# Patient Record
Sex: Female | Born: 1970 | Hispanic: Yes | Marital: Married | Smoking: Never smoker
Health system: Southern US, Community
[De-identification: ages and names within clinical notes are randomized; demographics above are authoritative.]

## PROBLEM LIST (undated history)

## (undated) DIAGNOSIS — K8020 Calculus of gallbladder without cholecystitis without obstruction: Secondary | ICD-10-CM

## (undated) DIAGNOSIS — N200 Calculus of kidney: Secondary | ICD-10-CM

## (undated) HISTORY — DX: Calculus of kidney: N20.0

---

## 2006-04-14 ENCOUNTER — Emergency Department: Payer: Self-pay | Admitting: Emergency Medicine

## 2006-04-27 ENCOUNTER — Ambulatory Visit: Payer: Self-pay | Admitting: General Surgery

## 2007-09-12 ENCOUNTER — Emergency Department: Payer: Self-pay | Admitting: Internal Medicine

## 2015-08-20 ENCOUNTER — Other Ambulatory Visit: Payer: Self-pay | Admitting: Physician Assistant

## 2015-08-20 DIAGNOSIS — Z1231 Encounter for screening mammogram for malignant neoplasm of breast: Secondary | ICD-10-CM

## 2015-08-27 ENCOUNTER — Ambulatory Visit: Payer: Self-pay

## 2015-09-05 ENCOUNTER — Ambulatory Visit: Payer: Self-pay | Attending: Oncology

## 2019-11-22 ENCOUNTER — Ambulatory Visit: Payer: Self-pay | Attending: Internal Medicine

## 2019-11-22 DIAGNOSIS — Z20822 Contact with and (suspected) exposure to covid-19: Secondary | ICD-10-CM

## 2019-11-22 DIAGNOSIS — U071 COVID-19: Secondary | ICD-10-CM | POA: Insufficient documentation

## 2019-11-23 LAB — NOVEL CORONAVIRUS, NAA: SARS-CoV-2, NAA: DETECTED — AB

## 2019-11-24 ENCOUNTER — Telehealth: Payer: Self-pay | Admitting: Nurse Practitioner

## 2019-11-24 NOTE — Telephone Encounter (Signed)
Called to discuss with Lori Tate about Covid symptoms and the use of bamlanivimab, a monoclonal antibody infusion for those with mild to moderate Covid symptoms and at a high risk of hospitalization.     Pt is qualified for this infusion at the [facility] infusion center due to being a member of an at-risk group. Per chart review patient has no qualified co-morbidities. Attempted to contact patient and further discuss qualifications and assess for eligibility to receive infusion.   Unable to reach patient. Will send MyChart message in addition to left message.   Willette Alma, AGPCNP-BC Pager: [phone] Amion: Thea Alken

## 2021-07-23 NOTE — Progress Notes (Signed)
Patient pre-screened for BCCCP eligibility due to COVID 19 precautions. Two patient identifiers used for verification that I was speaking to correct patient.  Patient to Present directly to [facility] 07/24/21 for BCCCP screening mammogram.

## 2021-07-24 ENCOUNTER — Ambulatory Visit
Admission: RE | Admit: 2021-07-24 | Discharge: 2021-07-24 | Disposition: A | Discharge status: Home / Self Care | Payer: Self-pay | Source: Ambulatory Visit | Attending: Oncology | Admitting: Oncology

## 2021-07-24 ENCOUNTER — Other Ambulatory Visit: Payer: Self-pay

## 2021-07-24 ENCOUNTER — Ambulatory Visit: Payer: Self-pay | Attending: Oncology

## 2021-07-24 DIAGNOSIS — Z0000 Encounter for general adult medical examination without abnormal findings: Secondary | ICD-10-CM | POA: Insufficient documentation

## 2021-07-25 ENCOUNTER — Other Ambulatory Visit: Payer: Self-pay | Admitting: *Deleted

## 2021-07-25 ENCOUNTER — Inpatient Hospital Stay
Admission: RE | Admit: 2021-07-25 | Discharge: 2021-07-25 | Disposition: A | Discharge status: Home / Self Care | Payer: Self-pay | Source: Ambulatory Visit | Attending: *Deleted | Admitting: *Deleted

## 2021-07-25 DIAGNOSIS — Z1231 Encounter for screening mammogram for malignant neoplasm of breast: Secondary | ICD-10-CM

## 2021-08-04 NOTE — Progress Notes (Signed)
Letter mailed from [facility] to notify of normal mammogram results.  Patient to return in one year for annual screening.  Copy to [facility]. 

## 2022-07-22 ENCOUNTER — Other Ambulatory Visit: Payer: Self-pay | Admitting: Family Medicine

## 2022-07-22 DIAGNOSIS — Z1231 Encounter for screening mammogram for malignant neoplasm of breast: Secondary | ICD-10-CM

## 2022-09-21 IMAGING — MG MM DIGITAL SCREENING BILAT W/ TOMO AND CAD
8 series · 8 of 24 positions shown · non-contrast
Comparison: Previous exam(s).

CLINICAL DATA: Screening.

EXAM:
DIGITAL SCREENING BILATERAL MAMMOGRAM WITH TOMOSYNTHESIS AND CAD
TECHNIQUE: Bilateral screening digital craniocaudal and mediolateral oblique
mammograms were obtained. Bilateral screening digital breast
tomosynthesis was performed. The images were evaluated with
computer-aided detection.

[R MLO synth-2D]
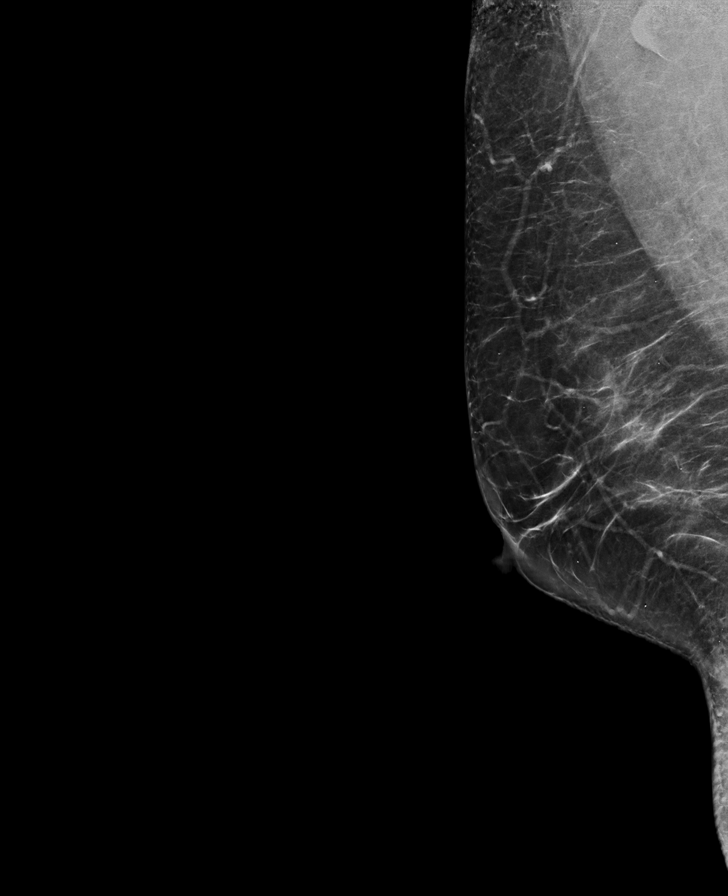

[L MLO synth-2D]
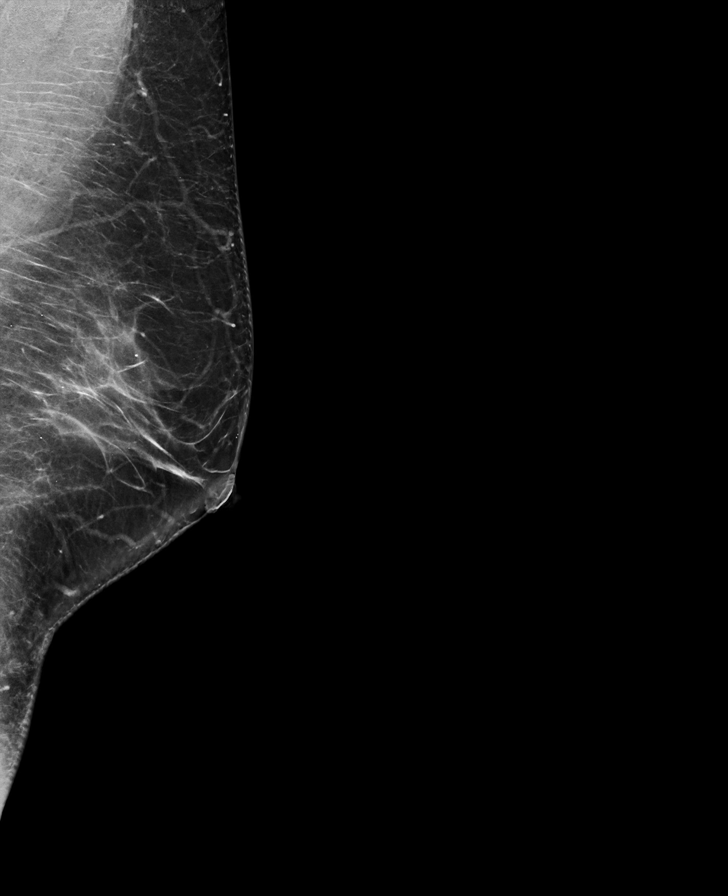

[L CC synth-2D]
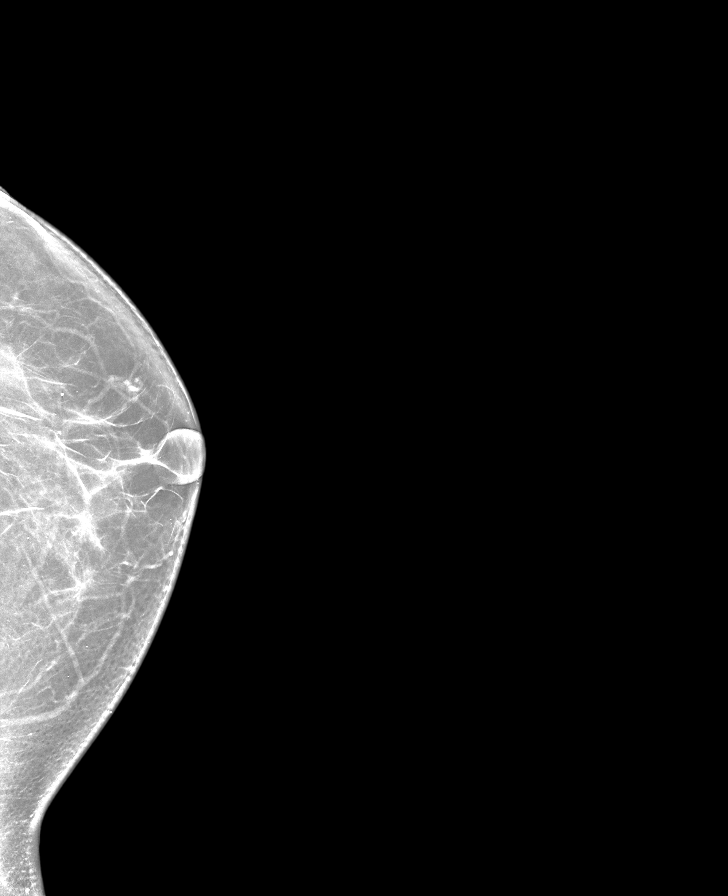

[R CC synth-2D]
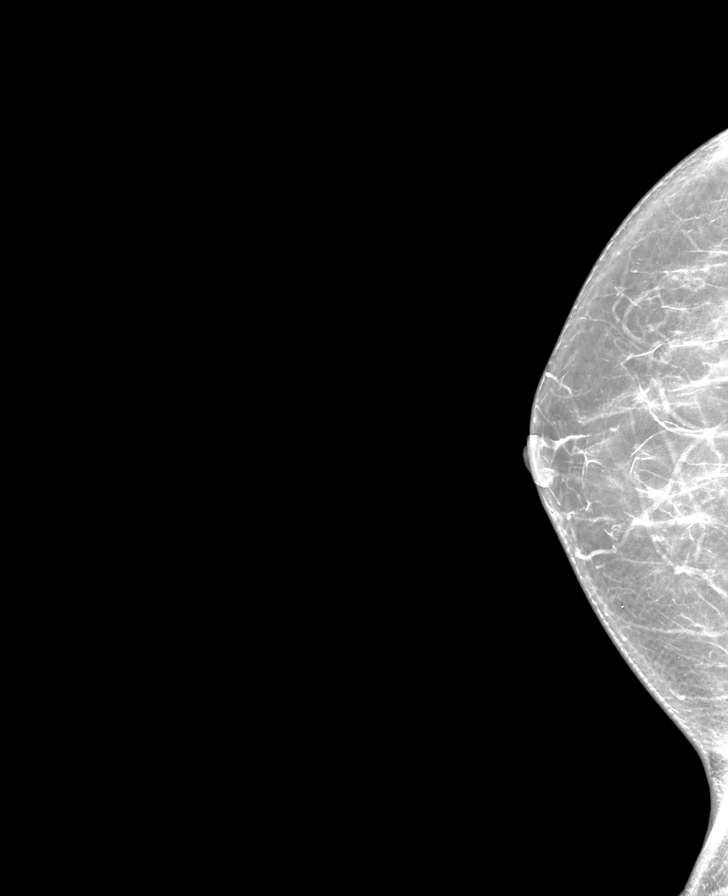

[R MLO tomo · tomo slice 39/77.0]
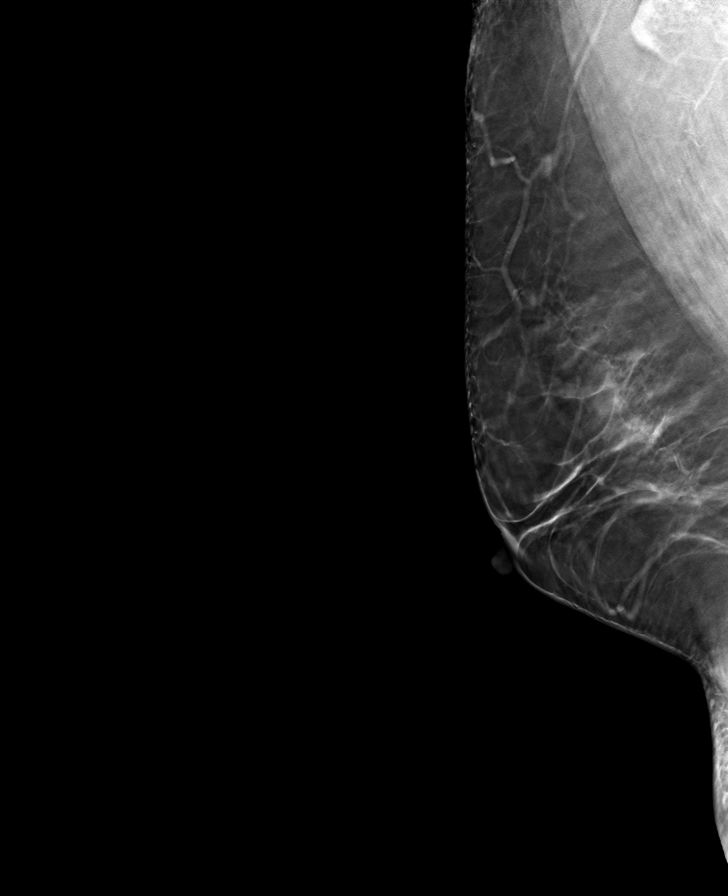

[L CC tomo · tomo slice 37/73.0]
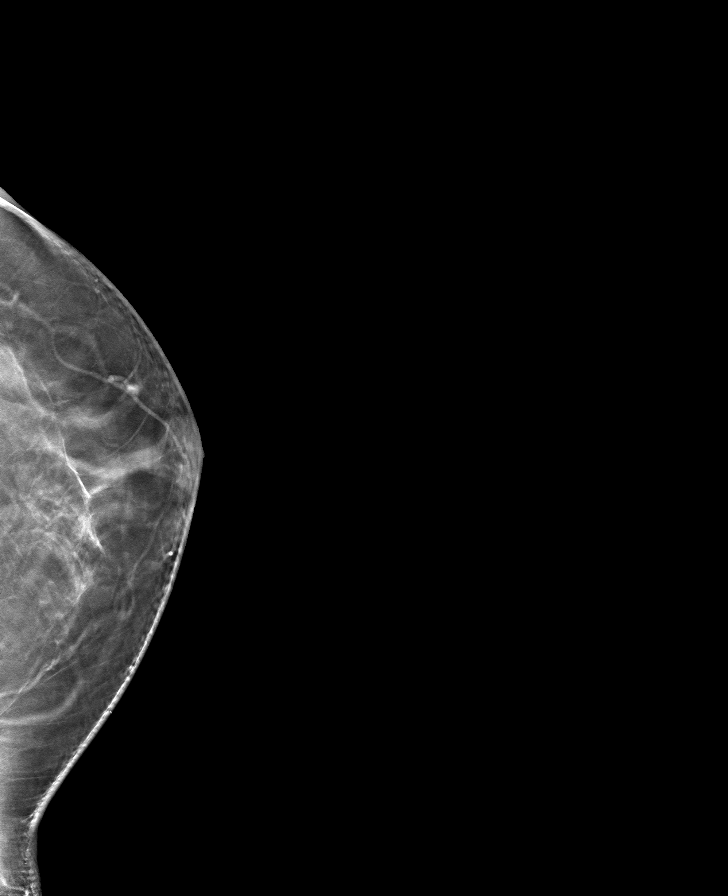

[L MLO tomo · tomo slice 41/81.0]
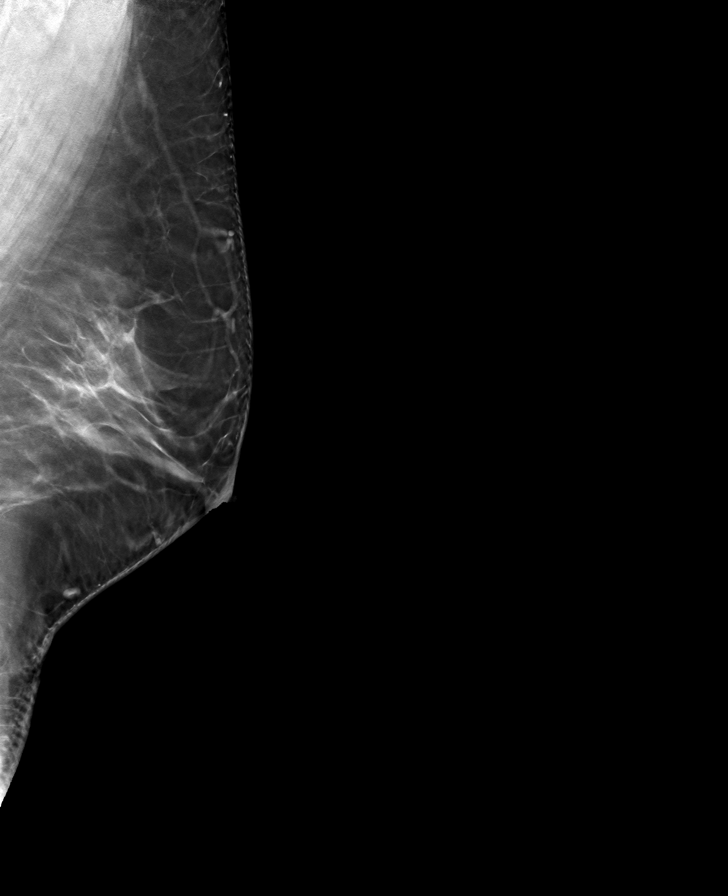

[R CC tomo · tomo slice 33/64.0]
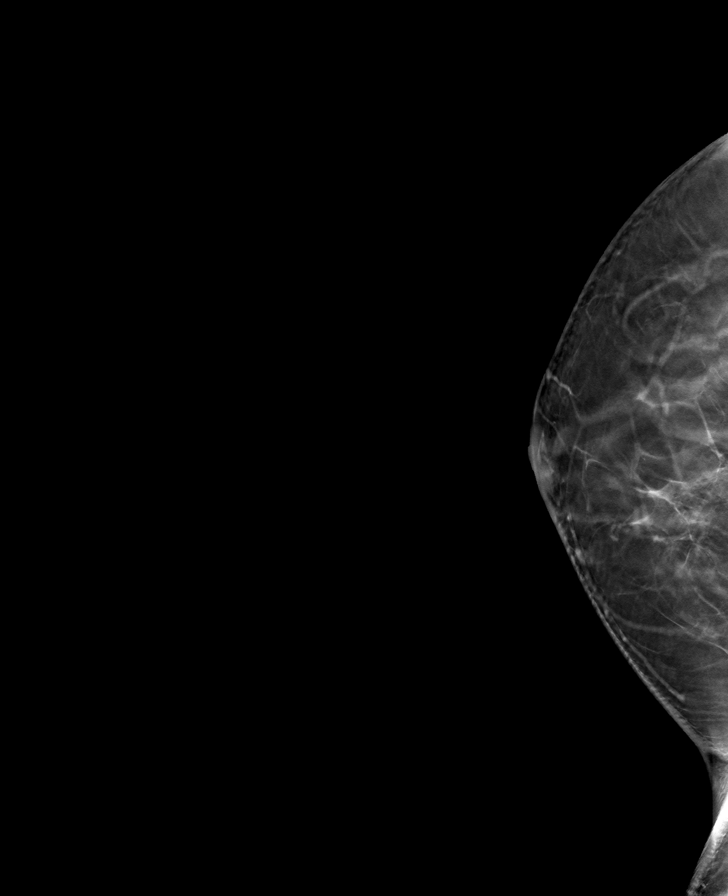

[8 of 24 positions shown; findings below may reference images not displayed]

ACR Breast Density Category b: There are scattered areas of
fibroglandular density.
FINDINGS: There are no findings suspicious for malignancy.
IMPRESSION: No mammographic evidence of malignancy. A result letter of this
screening mammogram will be mailed directly to the patient.

RECOMMENDATION:
Screening mammogram in one year. (Code:51-O-LD2)

BI-RADS CATEGORY  1: Negative.

## 2023-06-08 ENCOUNTER — Other Ambulatory Visit: Payer: Self-pay

## 2023-06-08 ENCOUNTER — Emergency Department
Admission: EM | Admit: 2023-06-08 | Discharge: 2023-06-08 | Disposition: A | Discharge status: Home / Self Care | Payer: 59 | Attending: Emergency Medicine | Admitting: Emergency Medicine

## 2023-06-08 ENCOUNTER — Emergency Department: Payer: 59

## 2023-06-08 DIAGNOSIS — R109 Unspecified abdominal pain: Secondary | ICD-10-CM | POA: Diagnosis not present

## 2023-06-08 DIAGNOSIS — N132 Hydronephrosis with renal and ureteral calculous obstruction: Secondary | ICD-10-CM | POA: Diagnosis not present

## 2023-06-08 DIAGNOSIS — K429 Umbilical hernia without obstruction or gangrene: Secondary | ICD-10-CM | POA: Diagnosis not present

## 2023-06-08 DIAGNOSIS — N134 Hydroureter: Secondary | ICD-10-CM | POA: Diagnosis not present

## 2023-06-08 DIAGNOSIS — N201 Calculus of ureter: Secondary | ICD-10-CM | POA: Diagnosis not present

## 2023-06-08 HISTORY — DX: Calculus of gallbladder without cholecystitis without obstruction: K80.20

## 2023-06-08 LAB — COMPREHENSIVE METABOLIC PANEL
ALT: 51 U/L — ABNORMAL HIGH (ref 0–44)
AST: 53 U/L — ABNORMAL HIGH (ref 15–41)
Albumin: 4.4 g/dL (ref 3.5–5.0)
Alkaline Phosphatase: 89 U/L (ref 38–126)
Anion gap: 15 (ref 5–15)
BUN: 17 mg/dL (ref 6–20)
CO2: 17 mmol/L — ABNORMAL LOW (ref 22–32)
Calcium: 9 mg/dL (ref 8.9–10.3)
Chloride: 106 mmol/L (ref 98–111)
Creatinine, Ser: 0.7 mg/dL (ref 0.44–1.00)
GFR, Estimated: >60 mL/min (ref >60)
Glucose, Bld: 119 mg/dL — ABNORMAL HIGH (ref 70–99)
Potassium: 3.8 mmol/L (ref 3.5–5.1)
Sodium: 138 mmol/L (ref 135–145)
Total Bilirubin: 1 mg/dL (ref 0.3–1.2)
Total Protein: 7.5 g/dL (ref 6.5–8.1)

## 2023-06-08 LAB — URINALYSIS, ROUTINE W REFLEX MICROSCOPIC
Bacteria, UA: NONE SEEN
Bilirubin Urine: NEGATIVE
Glucose, UA: NEGATIVE mg/dL
Ketones, ur: NEGATIVE mg/dL
Leukocytes,Ua: NEGATIVE
Nitrite: NEGATIVE
Protein, ur: 30 mg/dL — AB
RBC / HPF: >50 RBC/hpf (ref 0–5)
Specific Gravity, Urine: 1.024 (ref 1.005–1.030)
pH: 5 (ref 5.0–8.0)

## 2023-06-08 LAB — CBC
HCT: 43.5 % (ref 36.0–46.0)
Hemoglobin: 14.9 g/dL (ref 12.0–15.0)
MCH: 30.6 pg (ref 26.0–34.0)
MCHC: 34.3 g/dL (ref 30.0–36.0)
MCV: 89.3 fL (ref 80.0–100.0)
Platelets: 222 10*3/uL (ref 150–400)
RBC: 4.87 MIL/uL (ref 3.87–5.11)
RDW: 13.7 % (ref 11.5–15.5)
WBC: 8.7 10*3/uL (ref 4.0–10.5)
nRBC: 0 % (ref 0.0–0.2)

## 2023-06-08 LAB — LIPASE, BLOOD: Lipase: 36 U/L (ref 11–51)

## 2023-06-08 MED ORDER — ONDANSETRON HCL 4 MG/2ML IJ SOLN
4.0000 mg | Freq: Once | INTRAMUSCULAR | Status: AC
  Administered 2023-06-08: 4 mg via INTRAVENOUS
  Filled 2023-06-08: qty 2

## 2023-06-08 MED ORDER — MORPHINE SULFATE (PF) 4 MG/ML IV SOLN
4.0000 mg | Freq: Once | INTRAVENOUS | Status: AC
  Administered 2023-06-08: 4 mg via INTRAVENOUS
  Filled 2023-06-08: qty 1

## 2023-06-08 MED ORDER — TAMSULOSIN HCL 0.4 MG PO CAPS: 0.4000 mg | ORAL_CAPSULE | Freq: Every day | ORAL | 0 refills | Status: AC

## 2023-06-08 MED ORDER — KETOROLAC TROMETHAMINE 30 MG/ML IJ SOLN
30.0000 mg | Freq: Once | INTRAMUSCULAR | Status: AC
  Administered 2023-06-08: 30 mg via INTRAMUSCULAR
  Filled 2023-06-08: qty 1

## 2023-06-08 MED ORDER — ONDANSETRON 4 MG PO TBDP: 4.0000 mg | ORAL_TABLET | Freq: Three times a day (TID) | ORAL | 0 refills | Status: AC | PRN

## 2023-06-08 MED ORDER — OXYCODONE-ACETAMINOPHEN 5-325 MG PO TABS: 1.0000 | ORAL_TABLET | Freq: Four times a day (QID) | ORAL | 0 refills | Status: AC | PRN

## 2023-06-08 NOTE — Discharge Instructions (Addendum)
Call make an appointment with Dr. Signa Kell who is on-call for urology.  Also you need to see your primary care provider for follow-up of a cyst to your left ovary.  Medication was sent to your pharmacy in [place].  Do not drive or operate machinery while taking the pain medication as it could cause drowsiness and increase your risk for injury.  Turn to the emergency department if any severe worsening of your symptoms.

## 2023-06-08 NOTE — ED Provider Notes (Signed)
[facility] Provider Note    Event Date/Time   First MD Initiated Contact with Patient 06/08/23 1217     (approximate)   History   Flank Pain   HPI History obtained with daughter who is present. Lori Tate is a 52 y.o. female presents to the ED with complaint of right sided flank pain intermittently for the last couple of weeks but worse today with nausea and increased pain.  Patient does have a history of kidney stones in the past.  Patient has a history of gallstones in addition to her kidney stones.     Physical Exam   Triage Vital Signs: ED Triage Vitals  Encounter Vitals Group     BP 06/08/23 1113 (!) 151/93     Systolic BP Percentile --      Diastolic BP Percentile --      Pulse Rate 06/08/23 1113 70     Resp 06/08/23 1113 18     Temp 06/08/23 1113 98 F (36.7 C)     Temp src --      SpO2 06/08/23 1113 99 %     Weight 06/08/23 1115 155 lb (70.3 kg)     Height --      Head Circumference --      Peak Flow --      Pain Score 06/08/23 1115 8     Pain Loc --      Pain Education --      Exclude from Growth Chart --     Most recent vital signs: Vitals:   06/08/23 1113 06/08/23 1443  BP: (!) 151/93 (!) 148/76  Pulse: 70 68  Resp: 18 17  Temp: 98 F (36.7 C) 98.4 F (36.9 C)  SpO2: 99% 98%     General: Awake, no distress.  Obviously uncomfortable. CV:  Good peripheral perfusion.  Resp:  Normal effort.  Lungs are clear bilaterally. Abd:  No distention.  Other:  Right flank pain.   ED Results / Procedures / Treatments   Labs (all labs ordered are listed, but only abnormal results are displayed) Labs Reviewed  COMPREHENSIVE METABOLIC PANEL - Abnormal; Notable for the following components:      Result Value   CO2 17 (*)    Glucose, Bld 119 (*)    AST 53 (*)    ALT 51 (*)    All other components within normal limits  URINALYSIS, ROUTINE W REFLEX MICROSCOPIC - Abnormal; Notable for the following components:    Color, Urine YELLOW (*)    APPearance CLOUDY (*)    Hgb urine dipstick LARGE (*)    Protein, ur 30 (*)    All other components within normal limits  LIPASE, BLOOD  CBC     RADIOLOGY  CT renal stone study per radiologist shows a 3.3 mm right proximal calculus causing mild proximal hydronephrosis and hydroureter ureter.  Patient also has a fluid-filled cyst on the left adnexal area which they recommend having rechecked in approximately 3-6 months.  PROCEDURES:  Critical Care performed:   Procedures   MEDICATIONS ORDERED IN ED: Medications  morphine (PF) 4 MG/ML injection 4 mg (4 mg Intravenous Given 06/08/23 1243)  ondansetron (ZOFRAN) injection 4 mg (4 mg Intravenous Given 06/08/23 1243)  ketorolac (TORADOL) 30 MG/ML injection 30 mg (30 mg Intramuscular Given 06/08/23 1531)     IMPRESSION / MDM / ASSESSMENT AND PLAN / ED COURSE  I reviewed the triage vital signs and the nursing notes.  Differential diagnosis includes, but is not limited to, urinary tract infection, urolithiasis, ovarian cyst considered.  52 year old female presents to the ED with complaint of right flank pain with nausea and vomiting that started suddenly approximately 1 hour prior to arrival.  Patient has prior history of kidney stones.  With the help of Loyda, who is a Spanish interpreter here at [facility] explained to the patient that she does have a kidney stone and also the cyst that was noted on her left adnexal area.  Patient states that she has a appointment with her PCP on October 19 and will mention it to her doctor at that time.  Patient's pain was improved after being given IV morphine, Zofran prior to imaging and Toradol 30 mg IV.  Patient was discharged with a prescription for Zofran ODT, Percocet and Flomax.  She was given instructions to follow-up with Dr. Signa Kell who is on-call for urology or return to the emergency department if any severe worsening of her symptoms.      Patient's presentation is  most consistent with acute illness / injury with system symptoms.  FINAL CLINICAL IMPRESSION(S) / ED DIAGNOSES   Final diagnoses:  Ureterolithiasis     Rx / DC Orders   ED Discharge Orders          Ordered    tamsulosin (FLOMAX) 0.4 MG CAPS capsule  Daily        06/08/23 1510    oxyCODONE-acetaminophen (PERCOCET) 5-325 MG tablet  Every 6 hours PRN        06/08/23 1510    ondansetron (ZOFRAN-ODT) 4 MG disintegrating tablet  Every 8 hours PRN        06/08/23 1510             Note:  This document was prepared using Dragon voice recognition software and may include unintentional dictation errors.   Tommi Rumps, PA-C 06/08/23 1547    Trinna Post, MD 06/10/23 0001

## 2023-06-08 NOTE — ED Notes (Signed)
PT advises that she is having right lower quad flank pain. Pt sts that is has been going on for the last three days. Pt does endorse N/V.

## 2023-06-08 NOTE — ED Triage Notes (Signed)
Pt to ED for right sided flank pain intermittent for past couple weeks. Reports nausea with pain.

## 2023-06-22 DIAGNOSIS — Z1331 Encounter for screening for depression: Secondary | ICD-10-CM | POA: Diagnosis not present

## 2023-06-22 DIAGNOSIS — Z1389 Encounter for screening for other disorder: Secondary | ICD-10-CM | POA: Diagnosis not present

## 2023-06-22 DIAGNOSIS — Z87442 Personal history of urinary calculi: Secondary | ICD-10-CM | POA: Diagnosis not present

## 2023-06-22 DIAGNOSIS — N83292 Other ovarian cyst, left side: Secondary | ICD-10-CM | POA: Diagnosis not present

## 2023-06-23 ENCOUNTER — Ambulatory Visit (INDEPENDENT_AMBULATORY_CARE_PROVIDER_SITE_OTHER): Payer: Self-pay | Admitting: Urology

## 2023-06-23 ENCOUNTER — Encounter: Payer: Self-pay | Admitting: Urology

## 2023-06-23 VITALS — BP 124/72 | HR 58 | Ht <= 58 in | Wt 155.4 lb

## 2023-06-23 DIAGNOSIS — N200 Calculus of kidney: Secondary | ICD-10-CM

## 2023-06-23 DIAGNOSIS — N201 Calculus of ureter: Secondary | ICD-10-CM

## 2023-06-23 NOTE — Patient Instructions (Signed)
Recomendaciones dietticas para prevenir la formacin de clculos renales Dietary Guidelines to Help Prevent Kidney Stones Los clculos renales son depsitos de minerales y sales que se forman en el interior de los riones. Su riesgo de tener clculos renales puede ser mayor en funcin de su dieta, el estilo de vida, los medicamentos que toma y la presencia de ciertas afecciones mdicas. La mayora de las personas pueden reducir el riesgo de desarrollar clculos renales siguiendo estas pautas alimentarias. Su nutricionista puede darle indicaciones ms especficas segn su estado de salud general y el tipo de clculos renales que tienda a desarrollar. Consejos para seguir este plan Al leer las etiquetas de los alimentos  Elija alimentos con etiquetas que digan "sin sal agregada" o "bajo contenido de sal". Limite el consumo de sal (sodio) a menos de 1,500 mg por da. Elija alimentos con calcio para incluirlos en cada comida o refrigerio. Trate de incorporar 300 mg de calcio en cada comida. Entre los alimentos que contienen de 200 a 500 mg de calcio por porcin se incluyen los siguientes: 8 onzas (237 ml) de leche, leche no lcteafortificada con calcio y jugo de frutafortificado con calcio. Que una bebida est fortificada con calcio significa que se le ha aadido calcio. 8 onzas (237 ml) de kfir, yogur y yogur de soja. 4 onzas (114 ml) de tofu. 1 onza (28 g) de queso. 1 taza (150 g) de higos secos. 1 taza (91 g) de brcoli cocido. Una lata de 3 onzas (85 g) de sardinas o caballa. La mayora de las personas necesitan de 1,000 a 1,500 mg de calcio por da. Hable con su nutricionista sobre la cantidad de calcio que recomendara para usted. Al ir de compras Compre mucha fruta y verdura fresca. La mayora de las personas no necesitan dejar de consumir frutas y verduras, aun cuando contengan nutrientes que puedan contribuir a formar clculos renales. Cuando compre alimentos semipreparados, elija los  siguientes: Frutas enteras. Ensaladas preparadas con aderezo aparte. Batidos de fruta y yogur descremados. Evite comprar comidas congeladas o fiambres. Estos pueden tener un alto contenido de sodio. Busque alimentos con cultivos vivos, como yogur y kfir. Elija cereales con alto contenido de fibras, como panes integrales, salvado de avena y cereales de trigo integral. Al cocinar No agregue sal a los alimentos cuando cocine. Ponga un salero en la mesa y deje que cada persona agregue sal a su gusto. Para las pastas, guisos y sopas, use protenas vegetales, como frijoles, protena vegetal texturada o tofu, en lugar de carnes. Planificacin de las comidas Si su nutricionista se lo indica, ingiera menos sal. Para hacer esto: Evite consumir alimentos procesados o preelaborados. Evite las comidas rpidas. Coma menos protenas animales, como queso, carne de vaca, ave o pescado, si as se lo indica su nutricionista. Para hacer esto: Limite la cantidad de veces que ingiere carne de vaca, ave, pescado, o queso por semana. Siga una dieta sin carne vacuna, al menos 2 das por semana. Coma solo una porcin por da de carne de vaca, ave, pescado o mariscos. Cuando prepare protenas animales, corte los trozos en porciones pequeas. En trminos generales, una porcin de carne vacuna o de pescado tiene el tamao aproximado de la palma de la mano. Coma al menos cinco porciones de frutas y verduras frescas por da. Para hacer esto: Tenga a mano frutas y verduras para los refrigerios. Coma una fruta o un puado de frutos rojos con el desayuno. Coma una ensalada y fruta en el almuerzo. Incorpore dos clases de   verduras en la cena. Es posible que le hayan indicado que limite los alimentos con alto contenido de una sustancia llamada oxalato. Estos incluyen: Espinaca (cocida), ruibarbo, remolachas, batatas y acelga. Manes. Papas fritas de bolsa, papas fritas y papas al horno con piel. Frutos secos y productos con  frutos secos. Chocolate. Si toma diurticos regularmente, asegrese de comer al menos 1 o 2 porciones de frutas o verduras con alto contenido de potasio todos los das. Estos incluyen: [place]. Banana. Naranja, ciruela pasa, zanahoria, o jugo de tomate. Patata al horno. Repollo. Frijoles y arvejas partidas. Estilo de vida  Beba suficiente lquido como para mantener la orina de color amarillo plido. Esto es lo ms importante que puede hacer. Distribuya la ingesta de lquidos durante el da. Si bebe alcohol: Limite la cantidad que bebe a lo siguiente: De 0 a 1 medida por da para las mujeres que no estn embarazadas. De 0 a 2 medidas por da para los hombres. Sepa cunta cantidad de alcohol hay en las bebidas que toma. En los [place], una medida equivale a una botella de cerveza de 12 oz (355 ml), un vaso de vino de 5 oz (148 ml) o un vaso de una bebida alcohlica de alta graduacin de 1 oz (44 ml). Si su mdico se lo indica, baje de peso. Trabaje con su nutricionista para encontrar el plan de alimentacin y las estrategias para bajar de peso que mejor funcionen para usted. Informacin general Hable con su mdico y su nutricionista sobre tomar suplementos diarios. En funcin de su salud y de la causa de sus clculos renales, usted puede recibir las siguientes recomendaciones: No tomar suplementos de vitamina C en dosis altas (1000 mg al da o ms). Tomar un suplemento de calcio. Tomar un suplemento probitico diario. Tomar otros suplementos como magnesio, aceite de pescado o vitamina B6. Use los medicamentos de venta libre y los recetados solamente como se lo haya indicado el mdico. Estos incluyen suplementos. Qu alimentos debo limitar? Limite la ingesta de los siguientes alimentos, o cmalos segn se lo indique su nutricionista. Verduras Espinaca. Ruibarbo. Remolachas. Verduras enlatadas. Pepinillos. Aceitunas. Papas al horno con piel. Granos Salvado de trigo. Panificados.  Galletas saladas. Cereales con alto contenido de azcar. Carnes y otras protenas Frutos secos. Mantequilla de frutos secos. Porciones grandes de carne vacuna, de ave o de pescado. Carnes saladas, precocidas o curadas, como salchichas, panes de carne y hot dogs. Lcteos Quesos. Bebidas Refrescos regulares. Jugo de verduras regular. Alios y condimentos Mezclas de condimentos con sal. Condimentos para ensalada. Salsa de soja. Ktchup. Salsa barbacoa. Otros alimentos Sopas enlatadas. Salsa en lata para pastas. Guisos. Pizza. Lasaa. Comidas congeladas. Papas fritas en bolsa. Papas fritas. Es posible que los productos que se enumeran ms arriba no constituyan una lista completa de los alimentos y las bebidas que debe limitar. Consulte a un nutricionista para obtener ms informacin. Qu alimentos debo evitar? Hable con su nutricionista sobre los alimentos especficos que debera evitar de acuerdo con el tipo de clculos renales que tiene y su estado de salud general. Frutas Pomelo. Es posible que los productos que se enumeran ms arriba no constituyan una lista completa de los alimentos y las bebidas que debe evitar. Consulte a un nutricionista para obtener ms informacin. Resumen Los clculos renales son depsitos de minerales y sales que se forman en el interior de los riones. Puede reducir el riesgo de tener clculos renales si introduce cambios en su alimentacin. Lo ms importante es que beba mucho lquido. Beba   suficiente lquido como para Pharmacologist la orina de color amarillo plido. Hable con su nutricionista sobre la cantidad de calcio que debe consumir por da y consuma menos sal y protenas animales como se lo haya indicado el nutricionista. Esta informacin no tiene Theme park manager el consejo del mdico. Asegrese de hacerle al mdico cualquier pregunta que tenga. Document Revised: 02/04/2022 Document Reviewed: 02/04/2022 Elsevier Patient Education  2024 [employer].

## 2023-06-23 NOTE — Progress Notes (Signed)
   06/23/23 1:16 PM   SUMMERLIN DIMERY [facility] 10/25/1970 [id number]  CC: Nephrolithiasis  HPI: 52 year old Spanish-speaking female here today for evaluation of nephrolithiasis.  She presented to the ER on 06/08/2023 with severe right-sided flank pain, and CT showed a 3 mm right proximal ureteral stone.  She thinks she passed the stone, and has not had any pain since a few days after being seen in the ER.  She denies any gross hematuria or urinary symptoms.  No prior stone events.  PMH: Past Medical History:  Diagnosis Date   Gallstones    Kidney stone     Family History: Family History  Problem Relation Age of Onset   Breast cancer Neg Hx     Social History:  reports that she has never smoked. She has never used smokeless tobacco. She reports that she does not currently use alcohol. She reports that she does not use drugs.  Physical Exam: BP 124/72 (BP Location: Left Arm, Patient Position: Sitting, Cuff Size: Large)   Pulse (!) 58   Ht 4\' 10"  (1.473 m)   Wt 155 lb 6.4 oz (70.5 kg)   BMI 32.48 kg/m    Constitutional:  Alert and oriented, No acute distress. Cardiovascular: No clubbing, cyanosis, or edema. Respiratory: Normal respiratory effort, no increased work of breathing. GI: Abdomen is soft, nontender, nondistended, no abdominal masses   Laboratory Data: Reviewed  Pertinent Imaging: I have personally viewed and interpreted the CT stone protocol dated 06/08/2023 showing a 3 mm right proximal ureteral stone.  Assessment & Plan:   52 year old female with likely spontaneously passed 3 mm right sided ureteral stone with resolution of symptoms over the last 2 weeks.  Return precautions were discussed, and we spent most of our time today discussing stone prevention.  We discussed general stone prevention strategies including adequate hydration with goal of producing 2.5 L of urine daily, increasing citric acid intake, increasing calcium intake during high oxalate meals,  minimizing animal protein, and decreasing salt intake.  Also counseled to avoid high-dose vitamin C supplements.  Information about dietary recommendations given today.   Follow-up with urology as needed  Legrand Rams, MD 06/23/2023  [facility] [address] [place], [place] [address] [phone]

## 2023-07-29 ENCOUNTER — Other Ambulatory Visit: Payer: Self-pay | Admitting: Family Medicine

## 2023-07-29 DIAGNOSIS — R7309 Other abnormal glucose: Secondary | ICD-10-CM | POA: Diagnosis not present

## 2023-07-29 DIAGNOSIS — Z131 Encounter for screening for diabetes mellitus: Secondary | ICD-10-CM | POA: Diagnosis not present

## 2023-07-29 DIAGNOSIS — Z1389 Encounter for screening for other disorder: Secondary | ICD-10-CM | POA: Diagnosis not present

## 2023-07-29 DIAGNOSIS — E785 Hyperlipidemia, unspecified: Secondary | ICD-10-CM | POA: Diagnosis not present

## 2023-07-29 DIAGNOSIS — Z23 Encounter for immunization: Secondary | ICD-10-CM | POA: Diagnosis not present

## 2023-07-29 DIAGNOSIS — Z1231 Encounter for screening mammogram for malignant neoplasm of breast: Secondary | ICD-10-CM

## 2023-07-29 DIAGNOSIS — R7401 Elevation of levels of liver transaminase levels: Secondary | ICD-10-CM | POA: Diagnosis not present

## 2023-08-29 DIAGNOSIS — Z1211 Encounter for screening for malignant neoplasm of colon: Secondary | ICD-10-CM | POA: Diagnosis not present

## 2023-09-07 ENCOUNTER — Ambulatory Visit
Admission: RE | Admit: 2023-09-07 | Discharge: 2023-09-07 | Disposition: A | Discharge status: Home / Self Care | Payer: 59 | Source: Ambulatory Visit | Attending: Family Medicine | Admitting: Family Medicine

## 2023-09-07 DIAGNOSIS — Z1231 Encounter for screening mammogram for malignant neoplasm of breast: Secondary | ICD-10-CM | POA: Diagnosis not present

## 2023-09-17 ENCOUNTER — Other Ambulatory Visit: Payer: Self-pay | Admitting: Family Medicine

## 2023-09-17 DIAGNOSIS — N83292 Other ovarian cyst, left side: Secondary | ICD-10-CM

## 2023-09-22 ENCOUNTER — Ambulatory Visit
Admission: RE | Admit: 2023-09-22 | Discharge: 2023-09-22 | Disposition: A | Discharge status: Home / Self Care | Payer: 59 | Source: Ambulatory Visit | Attending: Family Medicine | Admitting: Family Medicine

## 2023-09-22 DIAGNOSIS — N83292 Other ovarian cyst, left side: Secondary | ICD-10-CM | POA: Diagnosis present

## 2023-09-28 ENCOUNTER — Encounter: Payer: Self-pay | Admitting: Obstetrics

## 2023-10-29 ENCOUNTER — Other Ambulatory Visit (HOSPITAL_COMMUNITY)
Admission: RE | Admit: 2023-10-29 | Discharge: 2023-10-29 | Disposition: A | Discharge status: Home / Self Care | Payer: Medicaid Other | Source: Ambulatory Visit | Attending: Obstetrics | Admitting: Obstetrics

## 2023-10-29 ENCOUNTER — Ambulatory Visit (INDEPENDENT_AMBULATORY_CARE_PROVIDER_SITE_OTHER): Payer: Self-pay | Admitting: Obstetrics

## 2023-10-29 ENCOUNTER — Encounter: Payer: Self-pay | Admitting: Obstetrics

## 2023-10-29 VITALS — BP 137/77 | HR 74 | Wt 155.0 lb

## 2023-10-29 DIAGNOSIS — R9389 Abnormal findings on diagnostic imaging of other specified body structures: Secondary | ICD-10-CM

## 2023-10-29 DIAGNOSIS — N950 Postmenopausal bleeding: Secondary | ICD-10-CM | POA: Diagnosis present

## 2023-10-29 DIAGNOSIS — N83202 Unspecified ovarian cyst, left side: Secondary | ICD-10-CM

## 2023-10-29 NOTE — Progress Notes (Signed)
GYNECOLOGY PROGRESS NOTE  Subjective:  PCP: [facility]  Patient ID: Lori Tate, female    DOB: 26-Apr-1971, 53 y.o.   MRN: [id number]  This encounter was facilitated by live Spanish language interpreter.   HPI  Patient is a 53 y.o. [id number] female who presents for referral from [place], Unk Pinto, MD  [facility] referring for L adnexal cyst incidentally noted on CTAP in 05/2023 with [place] repeat recommended in 3-30mo. Now is 9.4cm. Pt is not having bulk symptoms, no bladder or bowel complaints. Worried it's cancer.   Same US showed EMT of 12mm and today pt states she went for 78yrs without periods, then had episodes of bleeding in Nov 2024 and most recently earlier this month in Jan '25. Was light and self-limited, but she was surprised.   OB History     Gravida  3   Para  3   Term  2   Preterm  1   AB      Living  3      SAB      IAB      Ectopic      Multiple      Live Births             Pap: 63yrs ago, normal; denies hx of abnormal STI Hx: denies  Past Medical History:  Diagnosis Date   Gallstones    Kidney stone    Past Surgical History:  Procedure Laterality Date   CESAREAN SECTION     Family History  Problem Relation Age of Onset   Breast cancer Neg Hx    Social History   Socioeconomic History   Marital status: Married    Spouse name: Not on file   Number of children: Not on file   Years of education: Not on file   Highest education level: Not on file  Occupational History   Not on file  Tobacco Use   Smoking status: Never   Smokeless tobacco: Never  Substance and Sexual Activity   Alcohol use: Not Currently   Drug use: Never   Sexual activity: Yes  Other Topics Concern   Not on file  Social History Narrative   Not on file   Social Drivers of Health   Financial Resource Strain: Not on file  Food Insecurity: Not on file  Transportation Needs: Not on file   Physical Activity: Not on file  Stress: Not on file  Social Connections: Not on file  Intimate Partner Violence: Not on file   Current Outpatient Medications on File Prior to Visit  Medication Sig Dispense Refill   ondansetron (ZOFRAN-ODT) 4 MG disintegrating tablet Take 1 tablet (4 mg total) by mouth every 8 (eight) hours as needed for nausea or vomiting. (Patient not taking: Reported on 10/29/2023) 12 tablet 0   oxyCODONE-acetaminophen (PERCOCET) 5-325 MG tablet Take 1 tablet by mouth every 6 (six) hours as needed for severe pain. (Patient not taking: Reported on 10/29/2023) 15 tablet 0   tamsulosin (FLOMAX) 0.4 MG CAPS capsule Take 1 capsule (0.4 mg total) by mouth daily. (Patient not taking: Reported on 10/29/2023) 30 capsule 0   No current facility-administered medications on file prior to visit.   No Known Allergies  Review of Systems Pertinent items are noted in HPI.   Objective:   Blood pressure 137/77, pulse 74, weight 155 lb (70.3 kg), last menstrual period 10/24/2023. Body mass index is 32.4 kg/m.  General  appearance: alert, cooperative, and mildly obese Abdomen: soft, non-tender; bowel sounds normal; no masses,  no organomegaly Pelvic: cervix normal in appearance, external genitalia normal, no cervical motion tenderness, positive findings: fullness in left adnexal area, rectovaginal septum normal, uterus normal size, shape, and consistency, and vagina normal without discharge Extremities: extremities normal, atraumatic, no cyanosis or edema Neurologic: Grossly normal  09/22/23 US PELVIC COMPLETE FINDINGS: The uterus is anteverted in position and measures 9.3 x 5.0 x 5.9 cm. It demonstrates a normal, homogeneous echotexture. The endometrium measures 1.2 cm and demonstrates a normal homogeneous echotexture. Nabothian cysts are visualized within the cervix.   The right ovary measures 1.6 x 1.3 x 1.4 cm and demonstrates a normal echotexture. There is normal color Doppler  flow.   The left ovary measures 9.6 x 6.8 x 8.7 cm and demonstrates a 9.4 cm anechoic cyst. There is normal color Doppler flow.   There is no fluid present within the cul-de-sac.   Endometrial Biopsy Procedure Note  The patient was positioned on the exam table in the dorsal lithotomy position. Bimanual exam confirmed uterine position and size. A Graves speculum was placed into the vagina. A single toothed tenaculum was placed onto the anterior lip of the cervix. The pipette was placed into the endocervical canal and advanced to the uterine fundus. Using a piston like technique, with vacuum created by withdrawing the stylus, the endometrial specimen was obtained and transferred to the biopsy container. Minimal bleeding encountered. The procedure was well tolerated.   Uterine Position: mid    Uterine Length: 9 cm   Uterine Specimen: Scant  Assessment/Plan:   1. Left ovarian cyst   2. Postmenopausal bleeding   3. Thickened endometrium    Left ovarian cyst:  -Most likely benign, fluid-filled. Pt is asymptomatic. Will re-scan in 3 mos for surveillance.  -Torsion precautions for ED reviewed extensively; reviewed cyst may burst and how to manage symptoms at home if mild.  -Consider surgical removal if patient becomes symptomatic or cyst continues to enlarge.   PMB/Thickened Endometrium:  Discussed etiologies of postmenopausal bleeding, concern about precancerous/hyperplasia or cancerous etiology (5 to 10% percent of cases). Also discussed role of unopposed estrogen exposure in leading to thickened or proliferative endometrium; and its possible correlation with endometrial hyperplasia/carcinoma.  Discussed that obesity is linked to endometrial pathology given that adipose cells produce extra estrogen (estrone) which can cause the endometrium to have a significant amount of estrogen exposure.  However, she was reassured that endometrial atrophy and endometrial polyps are the most common causes  of postmenopausal bleeding.  Uterine bleeding in postmenopausal women is usually light and self-limited. Exclusion of cancer is the main objective; therefore, treatment is usually unnecessary once cancer has been excluded.  The primary goal in the diagnostic evaluation of postmenopausal women with uterine bleeding is to exclude malignancy; this can include endometrial biopsy and pelvic ultrasound.    -Will call with results of EMB and further treatment if warranted.    Julieanne Manson, DO [facility]

## 2023-10-29 NOTE — Patient Instructions (Signed)
Quiste ovrico Ovarian Cyst  Un quiste ovrico es una bolsa llena de lquido que se forma en el ovario. En su mayora, estos quistes desaparecen solos y no son cancerosos. Otros quistes necesitan tratamiento. Cules son las causas? Sndrome de hiperestimulacin ovrica. Algunos medicamentos pueden causar este problema. Sndrome del ovario poliqustico (SOP). Problemas con las sustancias qumicas del cuerpo (hormonas) pueden causar esta afeccin. El ciclo menstrual normal. Qu incrementa el riesgo? Tener sobrepeso u obesidad. Tomar medicamentos para aumentar la probabilidad de [place]. Usar algunos tipos de mtodos anticonceptivos. Fumar. Cules son los signos o sntomas? Muchos quistes ovricos no causan sntomas. Si tiene sntomas, pueden incluir lo siguiente: Dolor o presin en la zona [employer] de la cadera. Dolor en el abdomen bajo. Dolor al [employer]. Hinchazn en el abdomen bajo. Perodos menstruales irregulares. Dolor durante los perodos [place]. Cmo se trata? Muchos de los quistes ovricos desaparecen por s solos, sin TEFL teacher. Si necesita tratamiento, este puede incluir lo siguiente: Analgsicos. Extraccin del lquido del quiste. Extirpacin del quiste. Pldoras anticonceptivas u otros medicamentos. [place] para extirpar el ovario. Siga estas instrucciones en su casa: Use los medicamentos de venta libre y los recetados solamente como se lo haya indicado el mdico. Pregunte al mdico si debe evitar conducir o Chemical engineer mquinas mientras toma los medicamentos. Asegrese de realizarse exmenes y la prueba de [place]apan[place]ola[place] como se lo haya indicado el mdico. Regrese a sus actividades normales cuando el mdico le diga que es seguro. No fume ni consuma ningn producto que contenga nicotina o tabaco. Si necesita ayuda para dejar de consumir estos productos, consulte al mdico. Cumpla con todas las visitas de seguimiento. Comunquese con un mdico  si: Los perodos menstruales: Se retrasan. No son regulares. Cesan. Son dolorosos. Siente dolor en la zona [employer] de la cadera, y Chief Technology Officer no se va. Siente presin en la vejiga. Tiene dificultad para orinar. Se siente satisfecha, o el abdomen le duele, se inflama o se hincha. Sube o baja de peso sin proponrselo o tiene menos apetito de lo normal. Siente dolor y opresin en la espalda. Siente dolor y presin en la zona entre los huesos de la cadera. Cree que puede estar [place]. Solicite ayuda de inmediato si: Tiene un dolor muy intenso en el abdomen que empeora. Siente dolor en la zona [employer] de la cadera, y Chief Technology Officer es muy intenso o [place]. No puede comer ni beber sin vomitar. Tiene fiebre o escalofros repentinamente. Los perodos menstruales son ms abundantes de lo habitual. Resumen Un quiste ovrico es una bolsa llena de lquido que se forma en el ovario. Algunos quistes pueden causar problemas y Network engineer. En su Gwendolyn Fill, suelen desaparecer solos. Esta informacin no tiene Theme park manager el consejo del mdico. Asegrese de hacerle al mdico cualquier pregunta que tenga. Document Revised: 05/01/2020 Document Reviewed: 05/01/2020 Elsevier Patient Education  2024 [employer].

## 2023-11-02 LAB — SURGICAL PATHOLOGY

## 2024-03-21 ENCOUNTER — Encounter: Payer: Self-pay | Admitting: Obstetrics
